# Patient Record
Sex: Male | Born: 2005
Health system: Southern US, Community
[De-identification: ages and names within clinical notes are randomized; demographics above are authoritative.]

## PROBLEM LIST (undated history)

## (undated) DIAGNOSIS — E079 Disorder of thyroid, unspecified: Secondary | ICD-10-CM

## (undated) DIAGNOSIS — F32A Depression, unspecified: Secondary | ICD-10-CM

## (undated) DIAGNOSIS — J45909 Unspecified asthma, uncomplicated: Secondary | ICD-10-CM

## (undated) DIAGNOSIS — F419 Anxiety disorder, unspecified: Secondary | ICD-10-CM

## (undated) HISTORY — PX: LYMPH NODE BIOPSY: SHX201

## (undated) HISTORY — DX: Depression, unspecified: F32.A

## (undated) HISTORY — DX: Anxiety disorder, unspecified: F41.9

## (undated) HISTORY — DX: Disorder of thyroid, unspecified: E07.9

## (undated) HISTORY — DX: Unspecified asthma, uncomplicated: J45.909

---
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
## 2023-03-27 ENCOUNTER — Telehealth (INDEPENDENT_AMBULATORY_CARE_PROVIDER_SITE_OTHER): Payer: Self-pay

## 2023-03-27 NOTE — Telephone Encounter (Signed)
Who's calling (name and relationship to patient) : Curtis Dawson; mom   Best contact number: [phone]  Provider they see: Dr. Fransico Michael  Reason for call: Mom called in stating that [name]  use to see Dr. Fransico Michael and he has aged out. She wanted to know if we can help get him to see an adult Endo. They are needing a referral to see Dr. Talmage Nap     Call ID:      PRESCRIPTION REFILL ONLY  Name of prescription:  Pharmacy:

## 2023-03-28 NOTE — Telephone Encounter (Signed)
No DPR on file to speak to mom. My Chart has not been checked in a year, but will still send a message regarding this. Patient should contact PCP for a referral to adult endo.
